# Patient Record
Sex: Female | Born: 1980 | Race: Black or African American | Hispanic: No | Marital: Married
Health system: Southern US, Community
[De-identification: ages and names within clinical notes are randomized; demographics above are authoritative.]

---
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
## 2001-01-26 ENCOUNTER — Other Ambulatory Visit: Admission: RE | Admit: 2001-01-26 | Discharge: 2001-01-26 | Payer: Self-pay | Admitting: Obstetrics and Gynecology

## 2002-02-23 ENCOUNTER — Emergency Department (HOSPITAL_COMMUNITY): Admission: EM | Admit: 2002-02-23 | Discharge: 2002-02-23 | Payer: Self-pay | Admitting: Emergency Medicine

## 2003-12-02 ENCOUNTER — Other Ambulatory Visit: Admission: RE | Admit: 2003-12-02 | Discharge: 2003-12-02 | Payer: Self-pay | Admitting: Internal Medicine

## 2005-11-14 ENCOUNTER — Ambulatory Visit: Admission: AD | Admit: 2005-11-14 | Discharge: 2005-11-14 | Payer: Self-pay | Admitting: Emergency Medicine

## 2006-07-16 ENCOUNTER — Inpatient Hospital Stay (HOSPITAL_COMMUNITY): Admission: AD | Admit: 2006-07-16 | Discharge: 2006-07-16 | Payer: Self-pay | Admitting: Obstetrics and Gynecology

## 2006-08-14 ENCOUNTER — Inpatient Hospital Stay (HOSPITAL_COMMUNITY): Admission: AD | Admit: 2006-08-14 | Discharge: 2006-08-15 | Payer: Self-pay | Admitting: Obstetrics and Gynecology

## 2006-09-25 ENCOUNTER — Inpatient Hospital Stay (HOSPITAL_COMMUNITY): Admission: AD | Admit: 2006-09-25 | Discharge: 2006-09-27 | Payer: Self-pay | Admitting: Obstetrics and Gynecology

## 2006-09-29 ENCOUNTER — Encounter: Admission: RE | Admit: 2006-09-29 | Discharge: 2006-10-29 | Payer: Self-pay | Admitting: Obstetrics and Gynecology

## 2006-10-30 ENCOUNTER — Encounter: Admission: RE | Admit: 2006-10-30 | Discharge: 2006-11-28 | Payer: Self-pay | Admitting: Obstetrics and Gynecology

## 2006-11-29 ENCOUNTER — Encounter: Admission: RE | Admit: 2006-11-29 | Discharge: 2006-12-29 | Payer: Self-pay | Admitting: Obstetrics and Gynecology

## 2006-12-30 ENCOUNTER — Encounter: Admission: RE | Admit: 2006-12-30 | Discharge: 2007-01-28 | Payer: Self-pay | Admitting: Obstetrics and Gynecology

## 2007-01-29 ENCOUNTER — Encounter: Admission: RE | Admit: 2007-01-29 | Discharge: 2007-02-28 | Payer: Self-pay | Admitting: Obstetrics and Gynecology

## 2007-03-01 ENCOUNTER — Encounter: Admission: RE | Admit: 2007-03-01 | Discharge: 2007-03-27 | Payer: Self-pay | Admitting: Obstetrics and Gynecology

## 2007-11-13 ENCOUNTER — Emergency Department (HOSPITAL_COMMUNITY): Admission: EM | Admit: 2007-11-13 | Discharge: 2007-11-13 | Payer: Self-pay | Admitting: Family Medicine

## 2008-11-06 ENCOUNTER — Inpatient Hospital Stay (HOSPITAL_COMMUNITY): Admission: AD | Admit: 2008-11-06 | Discharge: 2008-11-06 | Payer: Self-pay | Admitting: Obstetrics and Gynecology

## 2009-01-09 ENCOUNTER — Emergency Department (HOSPITAL_COMMUNITY): Admission: EM | Admit: 2009-01-09 | Discharge: 2009-01-09 | Payer: Self-pay | Admitting: Family Medicine

## 2010-05-08 LAB — CULTURE, ROUTINE-ABSCESS

## 2010-05-10 LAB — CBC
HCT: 39.3 % (ref 36.0–46.0)
Hemoglobin: 12.6 g/dL (ref 12.0–15.0)
RBC: 5.04 MIL/uL (ref 3.87–5.11)
WBC: 10.2 10*3/uL (ref 4.0–10.5)

## 2010-05-10 LAB — DIFFERENTIAL
Eosinophils Relative: 1 % (ref 0–5)
Lymphocytes Relative: 19 % (ref 12–46)
Lymphs Abs: 1.9 10*3/uL (ref 0.7–4.0)
Monocytes Absolute: 0.8 10*3/uL (ref 0.1–1.0)
Monocytes Relative: 8 % (ref 3–12)
Neutro Abs: 7.4 10*3/uL (ref 1.7–7.7)

## 2010-05-10 LAB — URINALYSIS, ROUTINE W REFLEX MICROSCOPIC
Bilirubin Urine: NEGATIVE
Glucose, UA: NEGATIVE mg/dL
Ketones, ur: NEGATIVE mg/dL
Leukocytes, UA: NEGATIVE
Protein, ur: NEGATIVE mg/dL

## 2010-05-10 LAB — URINE MICROSCOPIC-ADD ON

## 2010-05-10 LAB — URINE CULTURE
Colony Count: NO GROWTH
Culture: NO GROWTH

## 2010-06-19 NOTE — Discharge Summary (Signed)
NAME:  Deanna Rodriguez, Deanna Rodriguez               ACCOUNT NO.:  [id number]   MEDICAL RECORD NO.:  [id number]          PATIENT TYPE:  INP   LOCATION:  9155                          FACILITY:  [facility]   PHYSICIAN:  Janine Limbo, M.D.DATE OF BIRTH:  1980-09-14   DATE OF ADMISSION:  08/14/2006  DATE OF DISCHARGE:  08/15/2006                               DISCHARGE SUMMARY   DISCHARGING PHYSICIAN:  Hal Morales, M.D.   ADMISSION DIAGNOSIS:  1. Intrauterine pregnancy at 33-6/7th's weeks.  2. Preterm contractions.   DISCHARGE DIAGNOSES:  1. Intrauterine pregnancy at 33-6/7th's weeks.  2. Preterm contractions, improved.  3. Muscle spasm.   HOSPITAL PROCEDURES:  1. Electronic fetal monitoring.  2. Terbutaline tocolysis.   HOSPITAL COURSE:  The patient was admitted with contractions at 33-  6/7th's weeks.  Fetal fibronectin was not done due to advanced  gestational age.  She was given terbutaline which spaced the  contractions out but then the contractions later returned.  She was,  therefore, admitted overnight for observation.  Cervix was long, closed,  and high.  She was observed overnight.  Group B strep was done.  She was  given IV hydration on August 15, 2006.  She was 34-0/7th's weeks.  She  rated her pain with contractions at a 4.  Vital signs were stable.  She  was afebrile.  Cardiac: Regular rate and rhythm.  Chest:  Clear to  auscultation.  She received Procardia x1 dose at 6:30 a.m.  Fetal  monitor showed a reactive fetal tracing with contractions every 2  minutes.  The cervix was fingertip to close, 20%, -3, vertex and soft.  She was given some Flexeril for spasm in her back and contractions  spaced out to 4 per hour and pain decreased to a 0 out of 10.  Cervix  was long, closed, and thick per Dr. Pennie Rushing.  And, she was deemed to  have received the full benefit from the hospital stay and discharged  home.   DISCHARGE MEDICATIONS:  1. Flexeril 10 mg p.o. q.8 h. p.r.n.  2.  Prenatal vitamins one p.o. daily.   DISCHARGE LABORATORY:  Group B strep pending.   DISCHARGE INSTRUCTIONS:  1. Return to work on August 18, 2006, if contractions resolved,      otherwise will reassess for returning to work.  2. She will do modified bed rest at home.   DISCHARGE FOLLOWUP:  On August 21, 2006, in the office as scheduled or  p.r.n. as indicated.   CONDITION ON DISCHARGE:  Good.      Marie L. Williams, C.N.M.      Janine Limbo, M.D.  Electronically Signed    MLW/MEDQ  D:  08/15/2006  T:  08/17/2006  Job:  [id number]

## 2010-06-19 NOTE — H&P (Signed)
NAME:  Deanna Rodriguez, Deanna Rodriguez               ACCOUNT NO.:  [id number]   MEDICAL RECORD NO.:  [id number]          PATIENT TYPE:  MAT   LOCATION:  MATC                          [facility]ILITY:  [facility]   PHYSICIAN:  Janine Limbo, M.D.DATE OF BIRTH:  09-Feb-1980   DATE OF ADMISSION:  08/14/2006  DATE OF DISCHARGE:                              HISTORY & PHYSICAL   Deanna Rodriguez is a 30 year old gravida 2, para 1, 0-0-1, at 33-6/7 weeks who  presents from the office with uterine contractions every five minutes,  per report.  Cervix in the office per Dr. Cloretta Ned exam was closed,  long, vertex presentation.  Fetal fibronectin was elected to not be done  due to patient's advanced gestational age.  While in maternity  admissions unit, patient was noted to be contracting every three minutes  initially.  She was given one dose of subcu terbutaline, which spaced  the contractions out for approximately 45 minutes, then they began to  return with the same degree of pattern and intensity.  Patient was  therefore counseled regarding the possibility of going home or being  admitted for overnight.  Patient elected, preferred to be admitted  overnight.   Pregnancy has been remarkable for:  1. History of abnormal Pap in 2004.  2. Migraines.  3. History of postpartum depression, but no medications were required.   PRENATAL LABS:  Blood type is O+.  Rh antibody negative.  VDRL  nonreactive.  Hepatitis B surface antigen negative.  HIV nonreactive.  Sickle cell test was negative.  GC and Chlamydia cultures were declined  in the first trimester.  Cystic fibrosis testing was negative.  Path was  normal.  HIV was nonreactive.  Hemoglobin upon entering the practice was  11.7.  It was within normal limits at 26 weeks.  Patient had normal  first trimester screen and normal AFP.  Her Glucola was also normal.  She had been seen at approximately 29 weeks for some contractions.  Fetal fibronectin was negative at that time.  The  rest of her pregnancy  was uncomplicated until her visit today.   OBSTETRICAL HISTORY:  In 2002, she had a vaginal birth of a female infant,  weight 7 pounds at 40 weeks.  She was in labor 16 hours.  She had no  complications.  During that pregnancy, she did have hyperemesis.  She  did have post partum depression for a few weeks following the birth of  that child but did not require any medications.   PAST MEDICAL HISTORY:  She had an abnormal Pap in 2004 and had a  colposcopy.  All Pap's have been normal since that time.  She reports  the usual childhood illnesses.  She does have a history of migraines and  was on Topamax in the past.   PAST SURGICAL HISTORY:  At age 78, she had some labial surgery and wisdom  teeth removed in 2003.  Her only other hospitalization was for  childbirth.   FAMILY HISTORY:  Her mother, paternal grandmother, maternal grandmother,  and paternal aunt all have hypertension.  Her sister has asthma and  COPD  as an adult.  Her paternal grandmother has heart disease.  Her mother  has migraines.  Her maternal grandmother has Alzheimer's.  Maternal  grandmother also has rheumatoid arthritis.  Her maternal grandfather has  prostate cancer.  Maternal uncle is a nicotine and alcohol user.  She  has some cousins who are drug users.   GENETIC HISTORY:  Remarkable for the father of the baby's aunt and  cousin having twins.   SOCIAL HISTORY:  Patient is married to the father of the baby.  He is  involved and supportive.  His name is Kenecia Barren.  Patient is [place]  American female of the [place] faith.  She is college-educated.  She  is a Designer, jewellery at [employer].  Her husband has high school  education.  He is employed at a [employer]iture conservator/restorer.  She has been followed  by the certified nurse midwife service at [facility].  She  denies any alcohol, drug, or tobacco use during this pregnancy.   She is sensitive to [facility], which causes nausea and  vomiting.   PHYSICAL EXAMINATION:  VITAL SIGNS:  Stable.  Patient is afebrile.  HEENT:  Within normal limits.  LUNGS:  Breath sounds are clear.  HEART:  Regular rate and rhythm without murmur.  BREASTS:  Soft and nontender.  ABDOMEN:  Fundal height is approximately 34 cm.  Estimated fetal weight  4-5 pounds.  PELVIC:  Uterine contractions currently have been every 2-6 minutes,  mild quality.  Cervical exam on repeat today:  Cervix is closed, long,  soft, with a vertex at a -2 station.  Fetal heart rate is reactive with  no decelerations.  Group B strep culture was done while in maternity  admissions.  EXTREMITIES:  Deep tendon reflexes are 2+ without clonus.  There is a  trace edema noted.   IMPRESSION:  1. Intrauterine pregnancy at 33-6/7 weeks.  2. Preterm uterine contraction, despite dose of terbutaline.   PLAN:  1. Admit to [facility] antenatal unit for consult with Dr.      Marline Backbone as the attending physician.  2. Routine physician orders.  3. Plan IV hydration and terbutaline p.r.n.      Renaldo Reel Emilee Hero, C.N.M.      Janine Limbo, M.D.  Electronically Signed    VLL/MEDQ  D:  08/14/2006  T:  08/14/2006  Job:  [id number]

## 2010-06-19 NOTE — H&P (Signed)
NAME:  Deanna Rodriguez, Deanna Rodriguez               ACCOUNT NO.:  [id number]   MEDICAL RECORD NO.:  [id number]          PATIENT TYPE:  INP   LOCATION:  9166                          FACILITY:  [facility]   PHYSICIAN:  Hal Morales, M.D.DATE OF BIRTH:  [birth year]   DATE OF ADMISSION:  09/25/2006  DATE OF DISCHARGE:                              HISTORY & PHYSICAL   Ms. Kercheval is a 30 year old gravida 2, para 1-0-0-1, at 39-6/7 weeks, who  presented complaining of questionable leaking x1 episode and uterine  contractions.  Pregnancy has been remarkable for:   1. Positive group B strep.  2. The patient is a Designer, jewellery.   PRENATAL LABS:  Blood type is O positive. Rh antibody negative.  VDRL  nonreactive.  Rubella titer positive.  Hepatitis B surface antigen  negative.  HIV nonreactive.  Sickle cell test is negative.  GC and  chlamydia cultures were declined in the first trimester.  They were also  declined in the third trimester.  Positive group B strep was noted.  Cystic fibrosis testing was negative.  Hemoglobin upon entering the  practice was 11.7.  It was within normal limits at 28 weeks.  First  trimester screen was done and was normal.  AFP was normal.  Glucola was  normal.   HISTORY OF PRESENT PREGNANCY:  The patient entered care at approximately  9 weeks.  She had had an ultrasound in the first trimester for dating  done at [facility].  She was placed on Zofran in the first trimester for  nausea.  She had a normal first trimester screen.  She had another  ultrasound at 18 weeks showing normal growth and development.  AFP was  normal.  Glucola was given and was negative.  She did have some  contractions at 29 weeks.  She went to maternity admissions unit for  evaluation.  Fetal fibronectin was negative at that time.  At that time  she also had an ultrasound showing fluid with an AFI of 21.09 at the  84th percentile.  Growth was at the 55th percentile and cervix was  within normal limits.  She  had another MAU evaluation at 33-6/7 weeks.  Cervix was fingertip, long, and the vertex was high.  Positive group B  strep was noted from that visit.  The rest of her pregnancy was  essentially uncomplicated.  She was seen in the office yesterday with  the cervix 2+, 60%, vertex at a -1 station.   OBSTETRICAL HISTORY:  In 2002 she had a vaginal birth of a female infant,  weight 7 pounds, at 40 weeks.  She was in labor 16 hours.  She had  epidural anesthesia.  She did have hyperemesis during that pregnancy.  She had some postpartum depression for a few weeks following the birth  of that child but did not require any medication.   MEDICAL HISTORY:  She had a colposcopy in 2004 with a normal Pap since.  At age 30 she had some surgery on her labia.  She reports the usual  childhood illnesses.  The patient has a history of migraines  and took  Topamax in the past but has not required anything during her pregnancy.   SURGICAL HISTORY:  At age 14 had labial surgery.  Wisdom teeth in 2003.  The only other hospitalization was for childbirth.   ALLERGIES:  SEPTRA, which causes nausea and vomiting.   FAMILY HISTORY:  Mother, paternal grandmother, maternal grandmother and  paternal aunt have hypertension.  Her sister has asthma and COPD.  Paternal grandmother had heart disease.  Maternal grandmother had  Alzheimer's.  Her mother also has migraines.  Maternal grandmother has  rheumatoid arthritis.  Maternal grandfather had prostate cancer.  Paternal uncle had nicotine, is a nicotine user and alcohol user.  Her  cousins use drugs.   GENETIC HISTORY:  Remarkable for the father of the baby's aunt and  cousin having twins.   SOCIAL HISTORY:  The patient is married to the father of the baby.  He  is involved and supportive.  His name is Charli Halle.  The patient is  college-educated.  She is a Designer, jewellery.  Her husband has a high  school education.  He is employed in a warehouse.  She has been  followed  by the certified nurse midwife service at [facility].  She  denies any alcohol, drug or tobacco use during this pregnancy.  The  patient is [place], of the [place] faith.  She is employed by  [employer].   PHYSICAL EXAM:  VITAL SIGNS:  Stable.  The patient is afebrile.  HEENT:  Within normal limits.  LUNGS:  Bilateral breath sounds are clear.  HEART:  Regular rate and rhythm without murmur.  BREASTS:  Soft and nontender.  ABDOMEN:  Fundal height is approximately 39 cm.  Estimated fetal weight  is 7-8 pounds.  Uterine contractions are every 2-3 minutes, 60 seconds  in duration, strong quality.  Cervix is 6-7 cm, 100%, vertex at a -2 station, with bulging bag of  water.  There is no leaking of fluid noted.  Fetal heart rate is initially nonreactive but no decelerations.  Now the  fetal heart rate is reactive and there is a negative spontaneous CST.  EXTREMITIES:  Deep tendon reflexes are 2+ without clonus.  There is a  trace edema noted.   IMPRESSION:  1. Intrauterine pregnancy at 39-6/7 weeks.  2. Active labor.  3. Positive group B strep.   PLAN:  1. Admit to birthing suite per consult with Dr. Dierdre Forth as      attending physician.  2. Routine certified nurse midwife orders.  3. Plan group B strep prophylaxis with ampicillin 2 g IV x1, then 1 g      IV q.4h.  4. Will try to achieve epidural placement before delivery ensues.      Renaldo Reel Emilee Hero, C.N.M.      Hal Morales, M.D.  Electronically Signed    VLL/MEDQ  D:  09/25/2006  T:  09/25/2006  Job:  [id number]

## 2010-08-15 ENCOUNTER — Other Ambulatory Visit (HOSPITAL_COMMUNITY)
Admission: RE | Admit: 2010-08-15 | Discharge: 2010-08-15 | Disposition: A | Discharge status: Home / Self Care | Payer: Self-pay | Source: Ambulatory Visit | Attending: Family Medicine | Admitting: Family Medicine

## 2010-08-15 ENCOUNTER — Other Ambulatory Visit: Payer: Self-pay | Admitting: Family Medicine

## 2010-08-15 DIAGNOSIS — Z124 Encounter for screening for malignant neoplasm of cervix: Secondary | ICD-10-CM | POA: Insufficient documentation

## 2010-11-16 LAB — CBC
HCT: 31.2 — ABNORMAL LOW
Hemoglobin: 10.3 — ABNORMAL LOW
Platelets: 128 — ABNORMAL LOW
Platelets: 173
RBC: 5.21 — ABNORMAL HIGH
RDW: 15.1 — ABNORMAL HIGH
WBC: 11.5 — ABNORMAL HIGH
WBC: 17.3 — ABNORMAL HIGH

## 2010-11-16 LAB — RPR: RPR Ser Ql: NONREACTIVE

## 2010-11-20 LAB — STREP B DNA PROBE: Strep Group B Ag: POSITIVE

## 2010-11-22 LAB — URINALYSIS, ROUTINE W REFLEX MICROSCOPIC
Bilirubin Urine: NEGATIVE
Hgb urine dipstick: NEGATIVE
Nitrite: NEGATIVE
Protein, ur: NEGATIVE
Specific Gravity, Urine: 1.025
Urobilinogen, UA: 0.2

## 2011-06-28 ENCOUNTER — Telehealth: Payer: Self-pay | Admitting: Obstetrics and Gynecology

## 2011-06-28 NOTE — Telephone Encounter (Signed)
Spoke with pt rgd msg pt can not find iud strings pt states wen to pcp do no need appt

## 2011-06-28 NOTE — Telephone Encounter (Signed)
Triage/epic 

## 2013-01-05 ENCOUNTER — Other Ambulatory Visit (HOSPITAL_COMMUNITY): Payer: Self-pay | Admitting: Physical Medicine and Rehabilitation

## 2013-01-05 DIAGNOSIS — M79604 Pain in right leg: Secondary | ICD-10-CM

## 2013-01-12 ENCOUNTER — Ambulatory Visit (HOSPITAL_COMMUNITY)
Admission: RE | Admit: 2013-01-12 | Discharge: 2013-01-12 | Disposition: A | Discharge status: Home / Self Care | Payer: 59 | Source: Ambulatory Visit | Attending: Physical Medicine and Rehabilitation | Admitting: Physical Medicine and Rehabilitation

## 2013-01-12 DIAGNOSIS — M79609 Pain in unspecified limb: Secondary | ICD-10-CM | POA: Insufficient documentation

## 2013-01-12 DIAGNOSIS — M5126 Other intervertebral disc displacement, lumbar region: Secondary | ICD-10-CM | POA: Insufficient documentation

## 2013-01-12 DIAGNOSIS — M79604 Pain in right leg: Secondary | ICD-10-CM

## 2013-03-03 ENCOUNTER — Ambulatory Visit: Payer: 59 | Admitting: Physical Therapy

## 2013-03-04 ENCOUNTER — Ambulatory Visit: Payer: 59 | Attending: Physical Medicine and Rehabilitation

## 2013-03-04 DIAGNOSIS — M545 Low back pain, unspecified: Secondary | ICD-10-CM | POA: Insufficient documentation

## 2013-03-04 DIAGNOSIS — IMO0001 Reserved for inherently not codable concepts without codable children: Secondary | ICD-10-CM | POA: Insufficient documentation

## 2013-03-04 DIAGNOSIS — R293 Abnormal posture: Secondary | ICD-10-CM | POA: Insufficient documentation

## 2013-03-04 DIAGNOSIS — R5381 Other malaise: Secondary | ICD-10-CM | POA: Insufficient documentation

## 2013-03-11 ENCOUNTER — Ambulatory Visit: Payer: 59 | Attending: Physical Medicine and Rehabilitation

## 2013-03-11 DIAGNOSIS — M545 Low back pain, unspecified: Secondary | ICD-10-CM | POA: Insufficient documentation

## 2013-03-11 DIAGNOSIS — R5381 Other malaise: Secondary | ICD-10-CM | POA: Insufficient documentation

## 2013-03-11 DIAGNOSIS — IMO0001 Reserved for inherently not codable concepts without codable children: Secondary | ICD-10-CM | POA: Insufficient documentation

## 2013-03-11 DIAGNOSIS — R293 Abnormal posture: Secondary | ICD-10-CM | POA: Insufficient documentation

## 2013-03-22 ENCOUNTER — Ambulatory Visit: Payer: 59

## 2013-07-09 ENCOUNTER — Ambulatory Visit
Admission: RE | Admit: 2013-07-09 | Discharge: 2013-07-09 | Disposition: A | Discharge status: Home / Self Care | Payer: 59 | Source: Ambulatory Visit | Attending: Family Medicine | Admitting: Family Medicine

## 2013-07-09 ENCOUNTER — Other Ambulatory Visit: Payer: Self-pay | Admitting: Family Medicine

## 2013-07-09 DIAGNOSIS — M25511 Pain in right shoulder: Secondary | ICD-10-CM

## 2013-07-26 ENCOUNTER — Other Ambulatory Visit (HOSPITAL_COMMUNITY): Payer: Self-pay | Admitting: Sports Medicine

## 2013-07-26 DIAGNOSIS — M25511 Pain in right shoulder: Secondary | ICD-10-CM

## 2013-07-27 ENCOUNTER — Ambulatory Visit (HOSPITAL_COMMUNITY)
Admission: RE | Admit: 2013-07-27 | Discharge: 2013-07-27 | Disposition: A | Discharge status: Home / Self Care | Payer: 59 | Source: Ambulatory Visit | Attending: Sports Medicine | Admitting: Sports Medicine

## 2013-07-27 ENCOUNTER — Other Ambulatory Visit (HOSPITAL_COMMUNITY): Payer: Self-pay | Admitting: Sports Medicine

## 2013-07-27 DIAGNOSIS — M25519 Pain in unspecified shoulder: Secondary | ICD-10-CM | POA: Insufficient documentation

## 2013-07-27 DIAGNOSIS — M25511 Pain in right shoulder: Secondary | ICD-10-CM

## 2013-07-27 DIAGNOSIS — X58XXXA Exposure to other specified factors, initial encounter: Secondary | ICD-10-CM | POA: Insufficient documentation

## 2013-07-27 DIAGNOSIS — M6789 Other specified disorders of synovium and tendon, multiple sites: Secondary | ICD-10-CM | POA: Insufficient documentation

## 2013-07-27 DIAGNOSIS — S46819A Strain of other muscles, fascia and tendons at shoulder and upper arm level, unspecified arm, initial encounter: Secondary | ICD-10-CM | POA: Insufficient documentation

## 2013-07-27 MED ORDER — IOHEXOL 180 MG/ML  SOLN
20.0000 mL | Freq: Once | INTRAMUSCULAR | Status: AC | PRN
  Administered 2013-07-27: 12 mL via INTRA_ARTICULAR

## 2013-07-27 MED ORDER — IOHEXOL 180 MG/ML  SOLN: 20.0000 mL | Freq: Once | INTRAMUSCULAR | Status: DC | PRN

## 2013-07-27 MED ORDER — GADOBENATE DIMEGLUMINE 529 MG/ML IV SOLN: 5.0000 mL | Freq: Once | INTRAVENOUS | Status: DC | PRN

## 2013-07-27 MED ORDER — GADOBENATE DIMEGLUMINE 529 MG/ML IV SOLN
5.0000 mL | Freq: Once | INTRAVENOUS | Status: AC | PRN
  Administered 2013-07-27: 0.1 mL via INTRAVENOUS

## 2015-09-16 IMAGING — CR DG SHOULDER 2+V*R*
3 series · 3 of 3 positions shown · non-contrast
Comparison: None.

CLINICAL DATA: Right shoulder pain after fall 2 weeks ago.

EXAM:
RIGHT SHOULDER - 2+ VIEW

[view not recorded (1 of 3)]
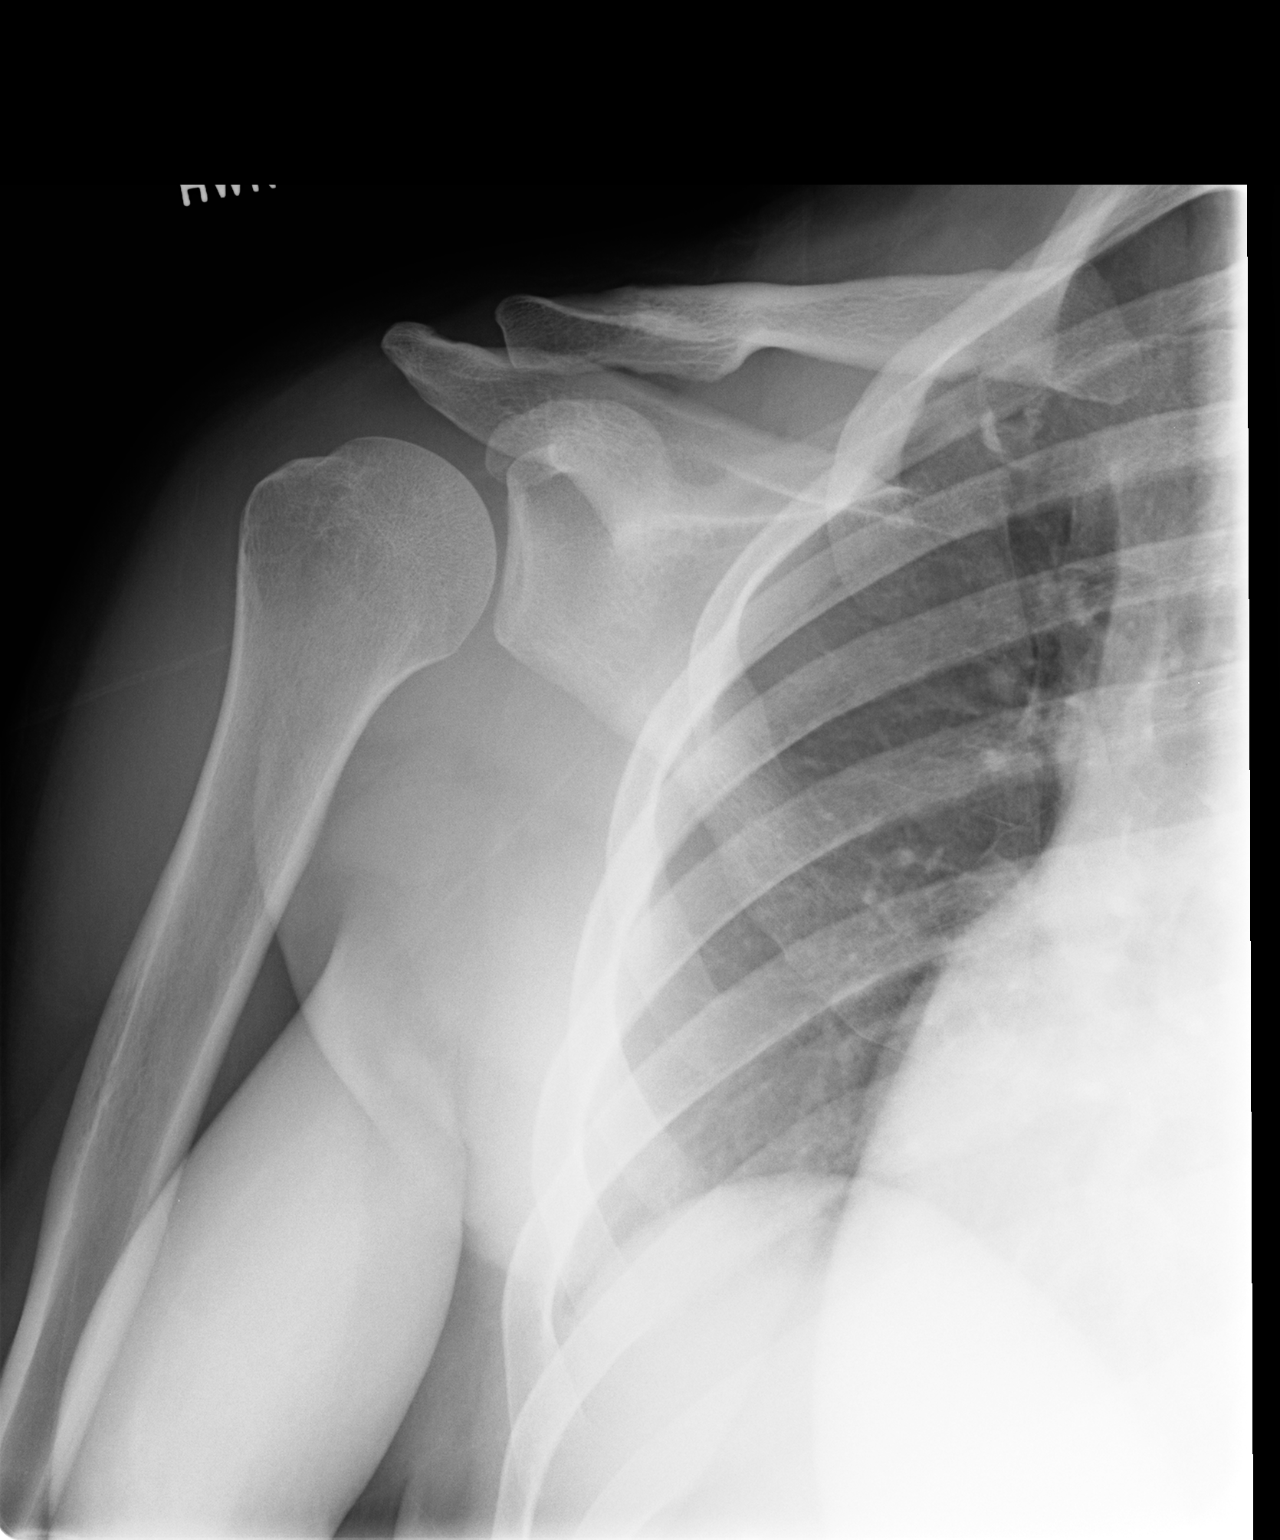

[view not recorded (2 of 3)]
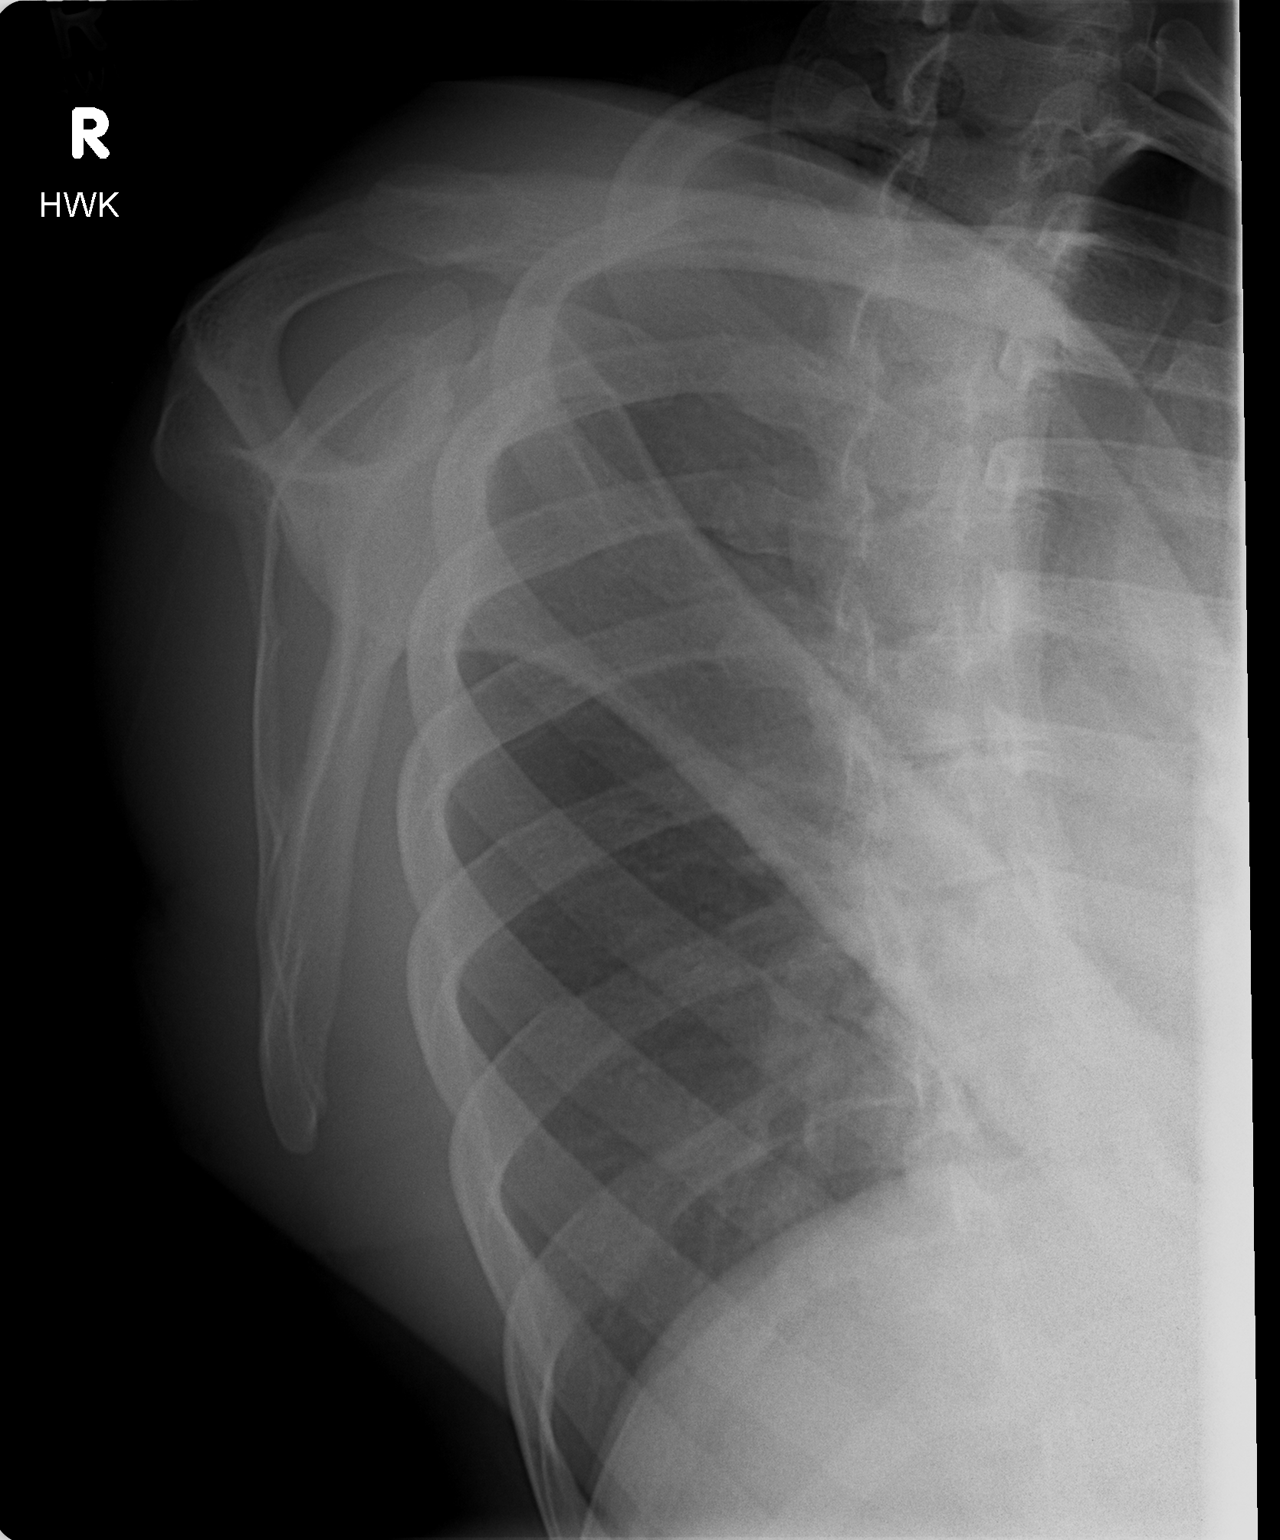

[view not recorded (3 of 3)]
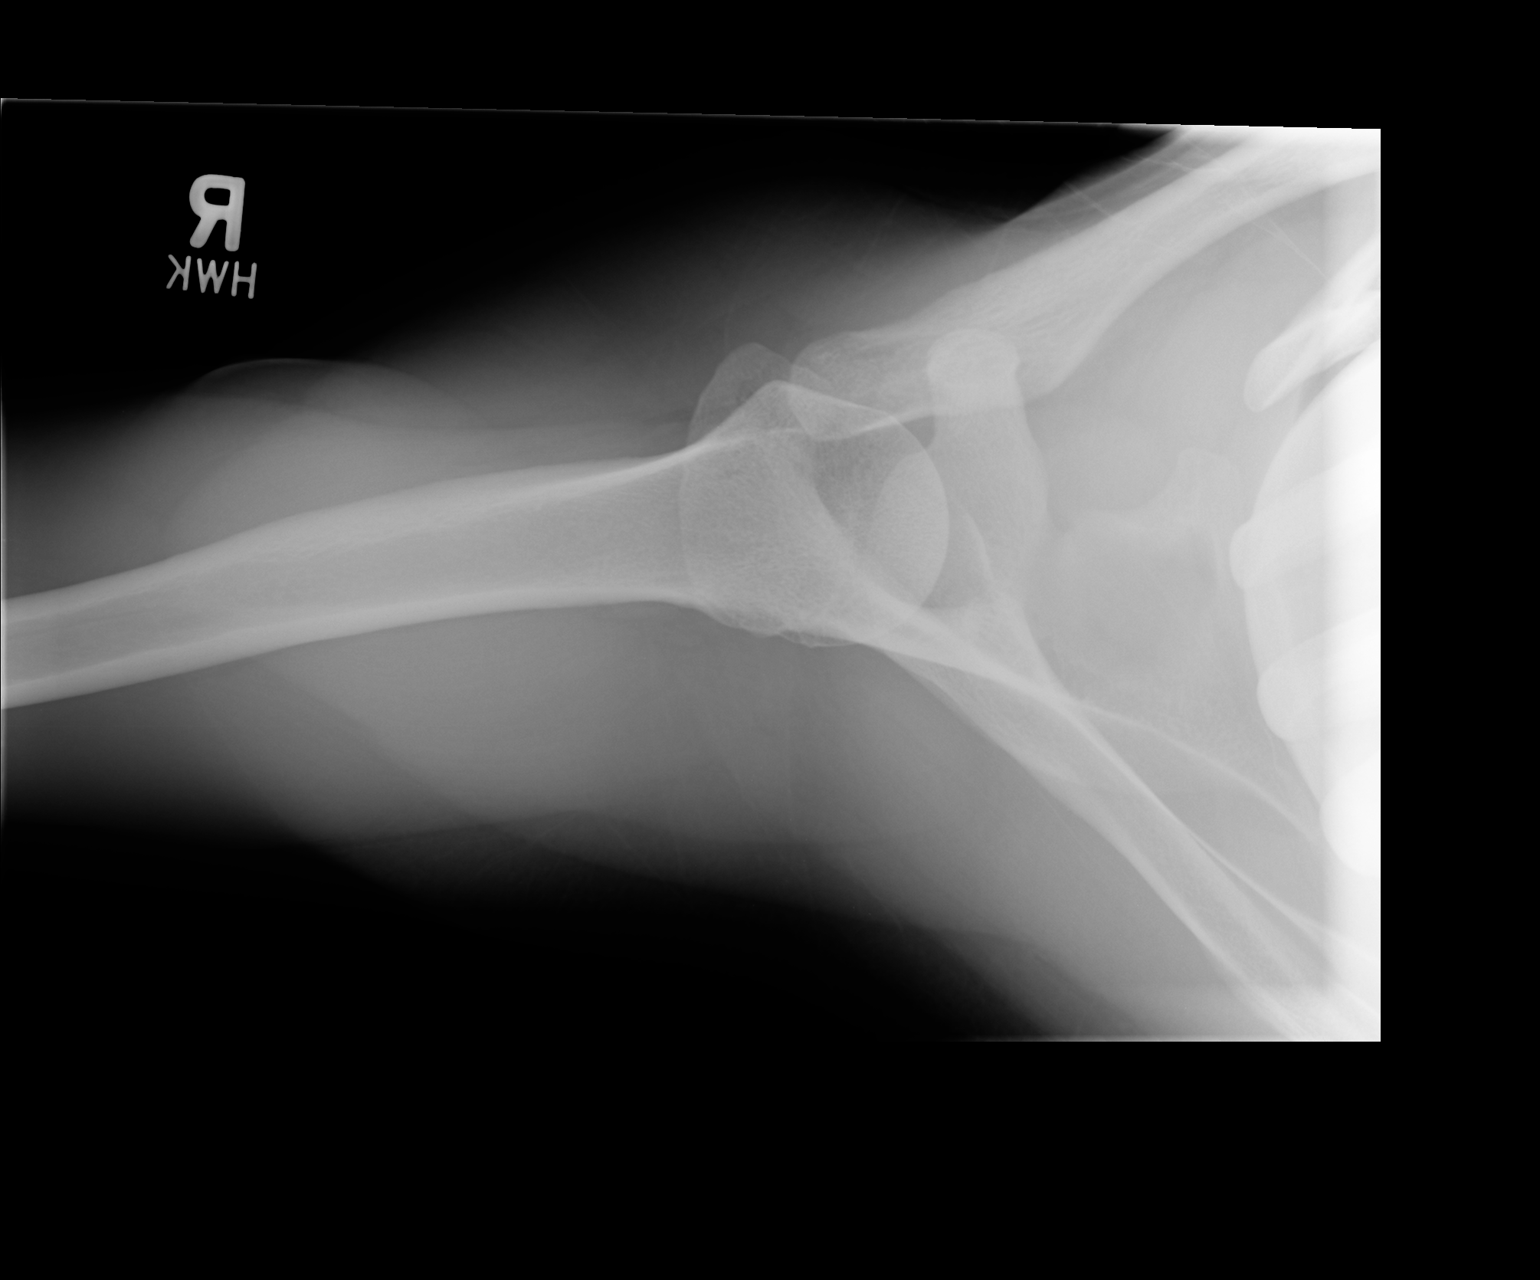

[3 of 3 positions shown; findings below may reference images not displayed]

FINDINGS: There is no evidence of fracture or dislocation. There is no
evidence of arthropathy or other focal bone abnormality. Soft
tissues are unremarkable.
IMPRESSION: Negative.

## 2019-04-10 ENCOUNTER — Ambulatory Visit: Payer: 59 | Attending: Internal Medicine

## 2019-04-10 DIAGNOSIS — Z23 Encounter for immunization: Secondary | ICD-10-CM | POA: Insufficient documentation

## 2019-04-10 NOTE — Progress Notes (Signed)
   Covid-19 Vaccination Clinic  Name:  Deanna Rodriguez    MRN: [id number] DOB: March 17, 1980  04/10/2019  [name] was observed post Covid-19 immunization for 15 minutes without incident. She was provided with Vaccine Information Sheet and instruction to access the V-Safe system.   [name] was instructed to call 911 with any severe reactions post vaccine: Marland Kitchen Difficulty breathing  . Swelling of face and throat  . A fast heartbeat  . A bad rash all over body  . Dizziness and weakness   Immunizations Administered    Name Date Dose VIS Date Route   Pfizer COVID-19 Vaccine 04/10/2019  4:14 PM 0.3 mL 01/15/2019 Intramuscular   Manufacturer: [employer], [employer]   Lot: [id number]   NDC: [address]-9

## 2019-05-01 ENCOUNTER — Ambulatory Visit: Payer: 59

## 2019-05-10 ENCOUNTER — Ambulatory Visit: Payer: 59

## 2019-05-10 ENCOUNTER — Ambulatory Visit: Payer: 59 | Attending: Internal Medicine

## 2019-05-10 DIAGNOSIS — Z23 Encounter for immunization: Secondary | ICD-10-CM

## 2019-05-10 NOTE — Progress Notes (Signed)
   Covid-19 Vaccination Clinic  Name:  SYMPHONY DEMURO    MRN: [id number] DOB: 03-25-1980  05/10/2019  Ms. Budzik was observed post Covid-19 immunization for 15 minutes without incident. She was provided with Vaccine Information Sheet and instruction to access the V-Safe system.   Ms. Fata was instructed to call 911 with any severe reactions post vaccine: Marland Kitchen Difficulty breathing  . Swelling of face and throat  . A fast heartbeat  . A bad rash all over body  . Dizziness and weakness   Immunizations Administered    Name Date Dose VIS Date Route   Pfizer COVID-19 Vaccine 05/10/2019  5:18 PM 0.3 mL 01/15/2019 Intramuscular   Manufacturer: [employer], [employer]   Lot: [id number]   NDC: [address]-7

## 2019-05-12 ENCOUNTER — Ambulatory Visit: Payer: 59

## 2021-09-03 ENCOUNTER — Other Ambulatory Visit: Payer: Self-pay | Admitting: Family Medicine

## 2021-09-03 DIAGNOSIS — Z1231 Encounter for screening mammogram for malignant neoplasm of breast: Secondary | ICD-10-CM

## 2021-09-06 ENCOUNTER — Ambulatory Visit
Admission: RE | Admit: 2021-09-06 | Discharge: 2021-09-06 | Disposition: A | Discharge status: Home / Self Care | Payer: No Typology Code available for payment source | Source: Ambulatory Visit | Attending: Family Medicine | Admitting: Family Medicine

## 2021-09-06 DIAGNOSIS — Z1231 Encounter for screening mammogram for malignant neoplasm of breast: Secondary | ICD-10-CM

## 2022-08-06 ENCOUNTER — Other Ambulatory Visit: Payer: Self-pay | Admitting: Family Medicine

## 2022-08-06 DIAGNOSIS — Z1231 Encounter for screening mammogram for malignant neoplasm of breast: Secondary | ICD-10-CM

## 2022-09-12 ENCOUNTER — Ambulatory Visit
Admission: RE | Admit: 2022-09-12 | Discharge: 2022-09-12 | Disposition: A | Discharge status: Home / Self Care | Payer: No Typology Code available for payment source | Source: Ambulatory Visit | Attending: Family Medicine | Admitting: Family Medicine

## 2022-09-12 DIAGNOSIS — Z1231 Encounter for screening mammogram for malignant neoplasm of breast: Secondary | ICD-10-CM

## 2023-07-30 ENCOUNTER — Other Ambulatory Visit: Payer: Self-pay | Admitting: Family Medicine

## 2023-07-30 DIAGNOSIS — Z1231 Encounter for screening mammogram for malignant neoplasm of breast: Secondary | ICD-10-CM

## 2023-09-15 ENCOUNTER — Ambulatory Visit
Admission: RE | Admit: 2023-09-15 | Discharge: 2023-09-15 | Disposition: A | Discharge status: Home / Self Care | Source: Ambulatory Visit | Attending: Family Medicine

## 2023-09-15 DIAGNOSIS — Z1231 Encounter for screening mammogram for malignant neoplasm of breast: Secondary | ICD-10-CM
